# Patient Record
Sex: Male | Born: 1990 | Hispanic: No | Marital: Single | Smoking: Former smoker
Health system: Southern US, Community
[De-identification: ages and names within clinical notes are randomized; demographics above are authoritative.]

---

## 2012-10-18 ENCOUNTER — Encounter: Payer: Self-pay | Admitting: *Deleted

## 2012-10-18 ENCOUNTER — Emergency Department
Admission: EM | Admit: 2012-10-18 | Discharge: 2012-10-18 | Disposition: A | Discharge status: Home / Self Care | Payer: Self-pay | Source: Home / Self Care | Attending: Family Medicine | Admitting: Family Medicine

## 2012-10-18 DIAGNOSIS — W57XXXA Bitten or stung by nonvenomous insect and other nonvenomous arthropods, initial encounter: Secondary | ICD-10-CM

## 2012-10-18 DIAGNOSIS — M7989 Other specified soft tissue disorders: Secondary | ICD-10-CM

## 2012-10-18 DIAGNOSIS — S60569A Insect bite (nonvenomous) of unspecified hand, initial encounter: Secondary | ICD-10-CM

## 2012-10-18 MED ORDER — METHYLPREDNISOLONE ACETATE 80 MG/ML IJ SUSP
80.0000 mg | Freq: Once | INTRAMUSCULAR | Status: AC
  Administered 2012-10-18: 80 mg via INTRAMUSCULAR

## 2012-10-18 NOTE — ED Notes (Signed)
Craig Valentine reprots being sting by a wasp to his right hand yesterday. Today he awoke with swelling to his right hand, denies pain, numbness or tingling.

## 2012-10-27 NOTE — ED Provider Notes (Signed)
CSN: [id number]     Arrival date & time 10/18/12  4098 History   First MD Initiated Contact with Patient 10/18/12 1003     Chief Complaint  Patient presents with  . Insect Bite    ?wasp sting    HPI  Insect sting on R hand  Was stung by wasp while working outside.  Area has gotten progressively swollen and red.  No pain, numbness, tingling.  No fevers, chills, purulent drainage.  No SOB, wheezing.    History reviewed. No pertinent past medical history. History reviewed. No pertinent past surgical history. Family History  Problem Relation Age of Onset  . Diabetes Other    History  Substance Use Topics  . Smoking status: Former Games developer  . Smokeless tobacco: Never Used  . Alcohol Use: No    Review of Systems  All other systems reviewed and are negative.    Allergies  Review of patient's allergies indicates no known allergies.  Home Medications  No current outpatient prescriptions on file. BP 123/74  Pulse 60  Temp(Src) 98.1 F (36.7 C) (Oral)  Resp 14  Ht 6' (1.829 m)  Wt 220 lb (99.791 kg)  BMI 29.83 kg/m2  SpO2 99% Physical Exam  Constitutional: He appears well-developed and well-nourished.  HENT:  Head: Normocephalic and atraumatic.  Eyes: Conjunctivae are normal. Pupils are equal, round, and reactive to light.  Neck: Normal range of motion.  Cardiovascular: Normal rate and regular rhythm.   Pulmonary/Chest: Effort normal.  Abdominal: Soft.  Musculoskeletal:       Hands: + erythema and swelling on dorsum of r hand  Nontender  Neurological: He is alert.  Skin: There is erythema.    ED Course  Procedures (including critical care time) Labs Review Labs Reviewed - No data to display Imaging Review No results found.    MDM   1. Insect bite    Insect bite with secondary inflammatory reaction.  Depomedrol 80mg  IM x1 Discussed infectious and systemic red flags.  Follow up as needed.     The patient and/or caregiver has been counseled  thoroughly with regard to treatment plan and/or medications prescribed including dosage, schedule, interactions, rationale for use, and possible side effects and they verbalize understanding. Diagnoses and expected course of recovery discussed and will return if not improved as expected or if the condition worsens. Patient and/or caregiver verbalized understanding.         Doree Albee, MD 10/27/12 [phone]

## 2015-10-02 ENCOUNTER — Emergency Department (HOSPITAL_COMMUNITY): Payer: Self-pay

## 2015-10-02 ENCOUNTER — Emergency Department (HOSPITAL_COMMUNITY)
Admission: EM | Admit: 2015-10-02 | Discharge: 2015-10-02 | Disposition: A | Discharge status: Home / Self Care | Payer: Self-pay | Attending: Emergency Medicine | Admitting: Emergency Medicine

## 2015-10-02 ENCOUNTER — Encounter (HOSPITAL_COMMUNITY): Payer: Self-pay

## 2015-10-02 DIAGNOSIS — T07XXXA Unspecified multiple injuries, initial encounter: Secondary | ICD-10-CM

## 2015-10-02 DIAGNOSIS — Y9389 Activity, other specified: Secondary | ICD-10-CM | POA: Insufficient documentation

## 2015-10-02 DIAGNOSIS — Z87891 Personal history of nicotine dependence: Secondary | ICD-10-CM | POA: Insufficient documentation

## 2015-10-02 DIAGNOSIS — Y92410 Unspecified street and highway as the place of occurrence of the external cause: Secondary | ICD-10-CM | POA: Insufficient documentation

## 2015-10-02 DIAGNOSIS — S7002XA Contusion of left hip, initial encounter: Secondary | ICD-10-CM | POA: Insufficient documentation

## 2015-10-02 DIAGNOSIS — S00512A Abrasion of oral cavity, initial encounter: Secondary | ICD-10-CM | POA: Insufficient documentation

## 2015-10-02 DIAGNOSIS — S50811A Abrasion of right forearm, initial encounter: Secondary | ICD-10-CM | POA: Insufficient documentation

## 2015-10-02 DIAGNOSIS — Y999 Unspecified external cause status: Secondary | ICD-10-CM | POA: Insufficient documentation

## 2015-10-02 MED ORDER — TETRACAINE HCL 0.5 % OP SOLN
1.0000 [drp] | Freq: Once | OPHTHALMIC | Status: AC
  Administered 2015-10-02: 1 [drp] via OPHTHALMIC
  Filled 2015-10-02: qty 4

## 2015-10-02 MED ORDER — FLUORESCEIN SODIUM 1 MG OP STRP
2.0000 | ORAL_STRIP | Freq: Once | OPHTHALMIC | Status: AC
  Administered 2015-10-02: 2 via OPHTHALMIC
  Filled 2015-10-02: qty 2

## 2015-10-02 MED ORDER — METHOCARBAMOL 500 MG PO TABS: 500.0000 mg | ORAL_TABLET | Freq: Two times a day (BID) | ORAL | 0 refills | Status: AC

## 2015-10-02 MED ORDER — NAPROXEN 500 MG PO TABS: 500.0000 mg | ORAL_TABLET | Freq: Two times a day (BID) | ORAL | 0 refills | Status: DC

## 2015-10-02 MED ORDER — IBUPROFEN 200 MG PO TABS
600.0000 mg | ORAL_TABLET | Freq: Once | ORAL | Status: AC
  Administered 2015-10-02: 600 mg via ORAL
  Filled 2015-10-02: qty 3

## 2015-10-02 NOTE — ED Notes (Signed)
Lamp, strips, and drops at bedside.
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
## 2015-10-02 NOTE — ED Notes (Signed)
Wounds cleaned w/ soap and water
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
## 2018-03-12 ENCOUNTER — Encounter (HOSPITAL_COMMUNITY): Payer: Self-pay

## 2018-03-12 ENCOUNTER — Emergency Department (HOSPITAL_COMMUNITY): Payer: Self-pay

## 2018-03-12 ENCOUNTER — Other Ambulatory Visit: Payer: Self-pay

## 2018-03-12 ENCOUNTER — Emergency Department (HOSPITAL_COMMUNITY)
Admission: EM | Admit: 2018-03-12 | Discharge: 2018-03-12 | Disposition: A | Discharge status: Home / Self Care | Payer: Self-pay | Attending: Emergency Medicine | Admitting: Emergency Medicine

## 2018-03-12 DIAGNOSIS — Z87891 Personal history of nicotine dependence: Secondary | ICD-10-CM | POA: Insufficient documentation

## 2018-03-12 DIAGNOSIS — M25512 Pain in left shoulder: Secondary | ICD-10-CM

## 2018-03-12 DIAGNOSIS — Y929 Unspecified place or not applicable: Secondary | ICD-10-CM | POA: Insufficient documentation

## 2018-03-12 DIAGNOSIS — Y999 Unspecified external cause status: Secondary | ICD-10-CM | POA: Insufficient documentation

## 2018-03-12 DIAGNOSIS — Y939 Activity, unspecified: Secondary | ICD-10-CM | POA: Insufficient documentation

## 2018-03-12 MED ORDER — IBUPROFEN 800 MG PO TABS
800.0000 mg | ORAL_TABLET | Freq: Once | ORAL | Status: AC
  Administered 2018-03-12: 800 mg via ORAL
  Filled 2018-03-12: qty 1

## 2018-03-12 MED ORDER — CYCLOBENZAPRINE HCL 10 MG PO TABS: 10.0000 mg | ORAL_TABLET | Freq: Two times a day (BID) | ORAL | 0 refills | Status: AC | PRN

## 2018-03-12 MED ORDER — IBUPROFEN 800 MG PO TABS: 800.0000 mg | ORAL_TABLET | Freq: Three times a day (TID) | ORAL | 0 refills | Status: AC | PRN

## 2018-03-12 MED ORDER — CYCLOBENZAPRINE HCL 10 MG PO TABS
5.0000 mg | ORAL_TABLET | Freq: Once | ORAL | Status: AC
  Administered 2018-03-12: 5 mg via ORAL
  Filled 2018-03-12: qty 1
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
## 2018-03-12 NOTE — Discharge Instructions (Signed)
It was my pleasure taking care of you today!  Ibuprofen as needed for pain. Flexeril is your muscle relaxer to take as needed. Ice shoulder throughout the day (instructions below).  Wear shoulder sling for no more than 3 days, then begin performing gentle range of motion exercises.   Call the orthopedist listed today or tomorrow to schedule a follow up appointment for recheck of ongoing shoulder pain in 1-2 weeks that can be canceled with a 24-48 hour notice if complete resolution of pain.   Return to the ER for new or worsening symptoms, any additional concerns.  COLD THERAPY DIRECTIONS:  Ice or gel packs can be used to reduce both pain and swelling. Ice is the most helpful within the first 24 to 48 hours after an injury or flareup from overusing a muscle or joint.  Ice is effective, has very few side effects, and is safe for most people to use.   If you expose your skin to cold temperatures for too long or without the proper protection, you can damage your skin or nerves. Watch for signs of skin damage due to cold.   HOME CARE INSTRUCTIONS  Follow these tips to use ice and cold packs safely.  Place a dry or damp towel between the ice and skin. A damp towel will cool the skin more quickly, so you may need to shorten the time that the ice is used.  For a more rapid response, add gentle compression to the ice.  Ice for no more than 10 to 20 minutes at a time. The bonier the area you are icing, the less time it will take to get the benefits of ice.  Check your skin after 5 minutes to make sure there are no signs of a poor response to cold or skin damage.  Rest 20 minutes or more in between uses.  Once your skin is numb, you can end your treatment. You can test numbness by very lightly touching your skin. The touch should be so light that you do not see the skin dimple from the pressure of your fingertip. When using ice, most people will feel these normal sensations in this order: cold, burning,  aching, and numbness.
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
## 2023-07-12 ENCOUNTER — Encounter (HOSPITAL_BASED_OUTPATIENT_CLINIC_OR_DEPARTMENT_OTHER): Payer: Self-pay

## 2023-07-12 ENCOUNTER — Emergency Department (HOSPITAL_BASED_OUTPATIENT_CLINIC_OR_DEPARTMENT_OTHER)
Admission: EM | Admit: 2023-07-12 | Discharge: 2023-07-12 | Disposition: A | Discharge status: Home / Self Care | Attending: Emergency Medicine | Admitting: Emergency Medicine

## 2023-07-12 ENCOUNTER — Other Ambulatory Visit: Payer: Self-pay

## 2023-07-12 ENCOUNTER — Emergency Department (HOSPITAL_BASED_OUTPATIENT_CLINIC_OR_DEPARTMENT_OTHER): Admitting: Radiology

## 2023-07-12 ENCOUNTER — Other Ambulatory Visit (HOSPITAL_BASED_OUTPATIENT_CLINIC_OR_DEPARTMENT_OTHER): Payer: Self-pay

## 2023-07-12 DIAGNOSIS — X58XXXA Exposure to other specified factors, initial encounter: Secondary | ICD-10-CM | POA: Insufficient documentation

## 2023-07-12 DIAGNOSIS — S3992XA Unspecified injury of lower back, initial encounter: Secondary | ICD-10-CM | POA: Insufficient documentation

## 2023-07-12 MED ORDER — NAPROXEN 500 MG PO TABS
500.0000 mg | ORAL_TABLET | Freq: Two times a day (BID) | ORAL | 0 refills | Status: AC
  Filled 2023-07-12: qty 30, 15d supply, fill #0

## 2023-07-12 MED ORDER — KETOROLAC TROMETHAMINE 60 MG/2ML IM SOLN
30.0000 mg | Freq: Once | INTRAMUSCULAR | Status: AC
  Administered 2023-07-12: 30 mg via INTRAMUSCULAR
  Filled 2023-07-12: qty 2

## 2023-07-12 NOTE — Discharge Instructions (Signed)
 While you were in the emergency room, you have an x-ray done that was normal.  We did an ultrasound of that area to see if there was a cyst but did not see 1.  I am sending a prescription for medicine called naproxen  that you can take 2 times per day.  He can also take over-the-counter Tylenol with this.  Sometimes, you can develop a cyst in that area.  Return to the emergency room if you start to feel a lump, notice any redness, or discharge from the area.  Follow-up with your PCP.

## 2023-07-12 NOTE — ED Provider Notes (Signed)
 [place] EMERGENCY DEPARTMENT AT [facility] Provider Note   CSN: [id number] Arrival date & time: 07/12/23  9193     Patient presents with: Tailbone Pain   Craig Valentine is a 33 y.o. male.  {Add pertinent medical, surgical, social history, OB history to HPI:32947} This is an otherwise healthy 33 year old male who is here today with pain in his left upper buttock.  Patient denies any trauma to the area.  He states over the last several days he has been having increasing pain in this area, worse with laying down or movement.  No fever or chills       Prior to Admission medications   Medication Sig Start Date End Date Taking? Authorizing Provider  naproxen  (NAPROSYN ) 500 MG tablet Take 1 tablet (500 mg total) by mouth 2 (two) times daily. 07/12/23  Yes Mannie Pac T, DO  cyclobenzaprine  (FLEXERIL ) 10 MG tablet Take 1 tablet (10 mg total) by mouth 2 (two) times daily as needed for muscle spasms. 03/12/18   Ward, Jaime Pilcher, PA-C  ibuprofen  (ADVIL ,MOTRIN ) 800 MG tablet Take 1 tablet (800 mg total) by mouth every 8 (eight) hours as needed for mild pain or moderate pain. 03/12/18   Ward, Ami Copes, PA-C  methocarbamol  (ROBAXIN ) 500 MG tablet Take 1 tablet (500 mg total) by mouth 2 (two) times daily. 10/02/15   Essie Stephens GRADE, PA-C    Allergies: Patient has no known allergies.    Review of Systems  Updated Vital Signs BP 123/69 (BP Location: Right Arm)   Pulse 75   Temp 97.9 F (36.6 C)   Resp 16   Ht 6' (1.829 m)   Wt 117.9 kg   SpO2 98%   BMI 35.26 kg/m   Physical Exam Vitals and nursing note reviewed.  Pulmonary:     Effort: Pulmonary effort is normal.   Musculoskeletal:        General: No swelling or deformity. Normal range of motion.     Cervical back: Normal range of motion.   Skin:    Comments: No appreciable pilonidal cyst, no rash    (all labs ordered are listed, but only abnormal results are displayed) Labs Reviewed - No data to  display  EKG: None  Radiology: DG Sacrum/Coccyx Result Date: 07/12/2023 CLINICAL DATA:  Pain. EXAM: SACRUM AND COCCYX - 2+ VIEW COMPARISON:  None Available. FINDINGS: Normal and symmetric sacroiliac joints. No acute fracture or dislocation. No aggressive osseous lesion. Visualized sacral arcuate lines are unremarkable. Unremarkable symphysis pubis. Unremarkable bilateral hip joints. No radiopaque foreign bodies. IMPRESSION: *No acute osseous abnormality of the sacrum and coccyx. Electronically Signed   By: Ree Molt M.D.   On: 07/12/2023 09:36    {Document cardiac monitor, telemetry assessment procedure when appropriate:32947} Procedures   Medications Ordered in the ED  ketorolac (TORADOL) injection 30 mg (30 mg Intramuscular Given 07/12/23 0900)      {Click here for ABCD2, HEART and other calculators REFRESH Note before signing:1}                              Medical Decision Making 33 year old male here today with left upper gluteal pain.  Differential diagnosis include pilonidal cyst, occult fracture, musculoskeletal pain, metastases, less likely cellulitis.  Plan-the patient's symptoms were consistent with a pilonidal process, however was unable to appreciate any cyst, did not visualize cyst on any ultrasound.  I have ordered plain films of the patient's sacrum.  Will provide some Toradol.  Reassessment 9:40 PM-negative plain films of the sacrum.  Will discharge patient with prescription for naproxen .  11 follow-up with his PCP.  Will advised patient to return to the ED if he starts to develop a lump or mass.  Amount and/or Complexity of Data Reviewed Radiology: ordered.  Risk Prescription drug management.     {Document critical care time when appropriate  Document review of labs and clinical decision tools ie CHADS2VASC2, etc  Document your independent review of radiology images and any outside records  Document your discussion with family members, caretakers and with  consultants  Document social determinants of health affecting pt's care  Document your decision making why or why not admission, treatments were needed:32947:::1}   Final diagnoses:  Injury of coccyx, initial encounter    ED Discharge Orders          Ordered    naproxen  (NAPROSYN ) 500 MG tablet  2 times daily        07/12/23 0943

## 2023-07-12 NOTE — ED Notes (Signed)
 DC paperwork given and verbally understood.

## 2023-07-12 NOTE — ED Triage Notes (Signed)
 In for eval of pain to pain to top of gluteal cleft. Denies injury or abscess. Denies blood in stool.

## 2023-07-22 ENCOUNTER — Other Ambulatory Visit (HOSPITAL_BASED_OUTPATIENT_CLINIC_OR_DEPARTMENT_OTHER): Payer: Self-pay
